# Patient Record
Sex: Male | Born: 2005 | Race: Black or African American | Hispanic: No | Marital: Single | Smoking: Never smoker
Health system: Southern US, Community
[De-identification: ages and names within clinical notes are randomized; demographics above are authoritative.]

## PROBLEM LIST (undated history)

## (undated) HISTORY — PX: NO PAST SURGERIES: SHX2092

---
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
## 2023-07-03 ENCOUNTER — Telehealth: Payer: Self-pay | Admitting: Primary Care

## 2023-07-03 NOTE — Telephone Encounter (Signed)
Called patient to schedule next available appt. No answer, left VM
